# Patient Record
Sex: Female | Born: 1995 | Race: Black or African American | Hispanic: No | Marital: Single | Smoking: Never smoker
Health system: Southern US, Community
[De-identification: ages and names within clinical notes are randomized; demographics above are authoritative.]

## PROBLEM LIST (undated history)

## (undated) DIAGNOSIS — Z789 Other specified health status: Secondary | ICD-10-CM

---
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
## 2015-02-18 IMAGING — CT CT ABD-PELV W/ CM
1 of 2 series · 15 of 32 positions shown, 19 images · IV contrast (omnipaque)
Comparison: None.

CLINICAL DATA: Acute onset of mid abdominal pain. No bowel
movements for 4 days. Leukocytosis. Initial encounter.

EXAM:
CT ABDOMEN AND PELVIS WITH CONTRAST
TECHNIQUE: Multidetector CT imaging of the abdomen and pelvis was performed
using the standard protocol following bolus administration of
intravenous contrast.
CONTRAST:  50mL OMNIPAQUE IOHEXOL 300 MG/ML SOLN, 80mL OMNIPAQUE
IOHEXOL 300 MG/ML SOLN

[Series 2: abd/pel with · axial · 0.65mm/px · z∈[-416,-66]mm · 15 of 78 slices shown, 19 images]
[im 4/78  soft-tissue]
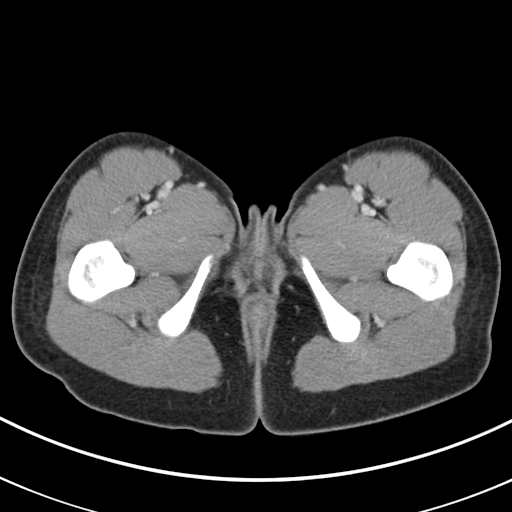
[im 4/78  bone]
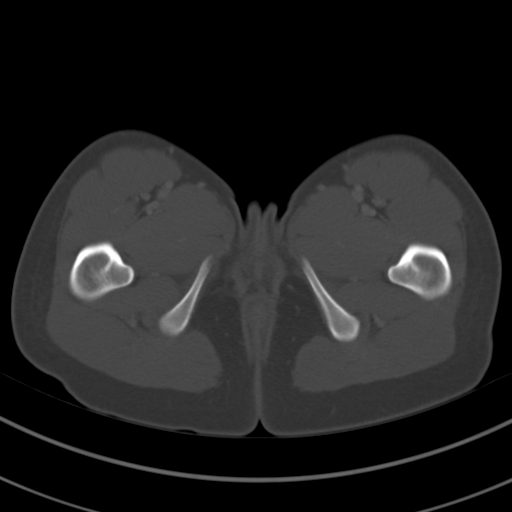
[im 10/78  soft-tissue]
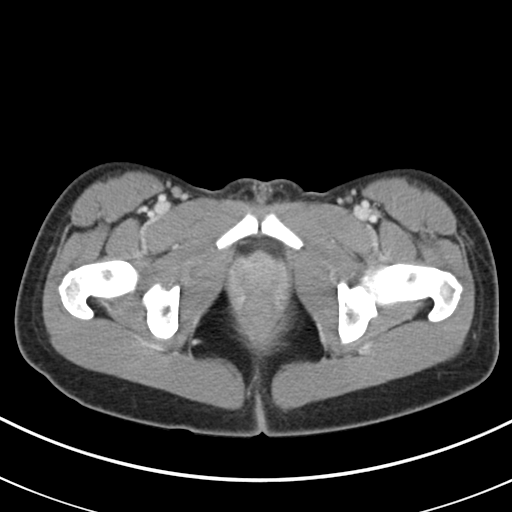
[im 17/78  soft-tissue]
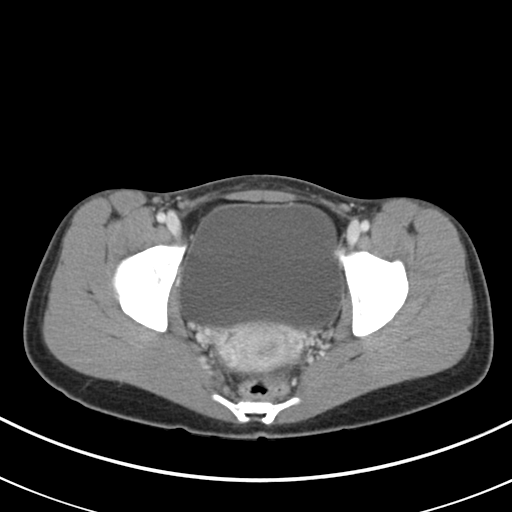
[im 23/78  soft-tissue]
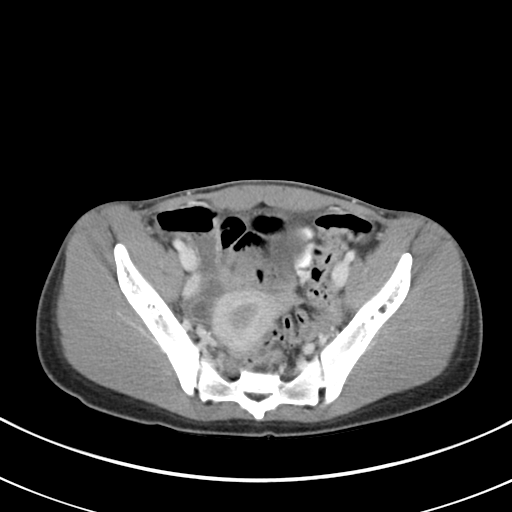
[im 26/78  soft-tissue]
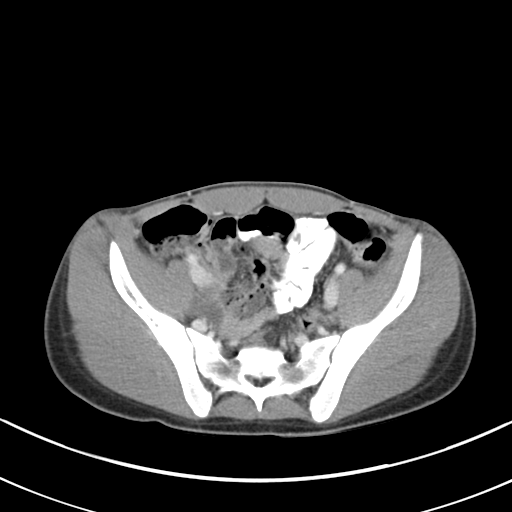
[im 33/78  soft-tissue]
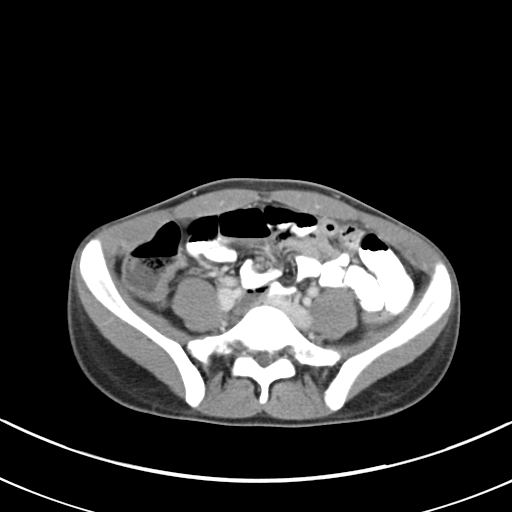
[im 39/78  soft-tissue]
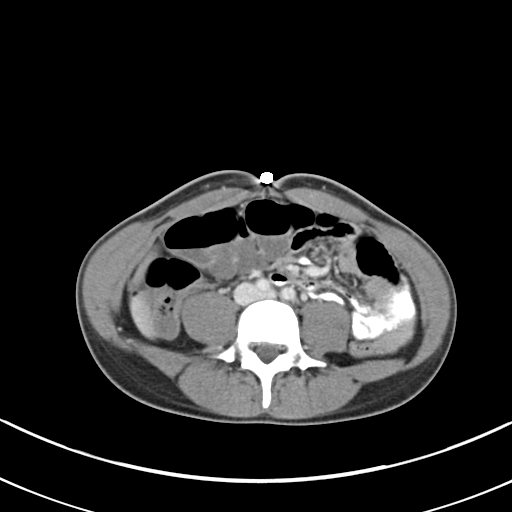
[im 45/78  soft-tissue]
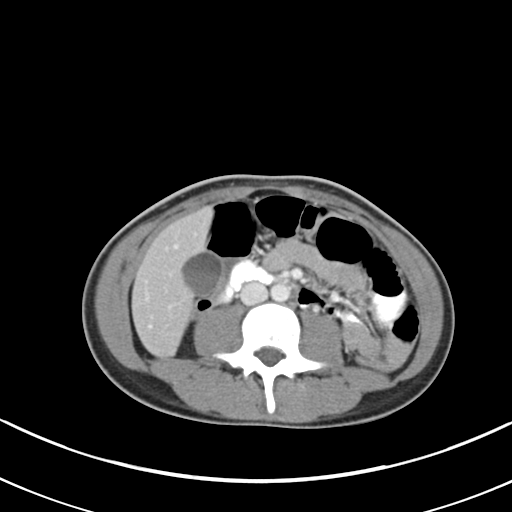
[im 52/78  soft-tissue]
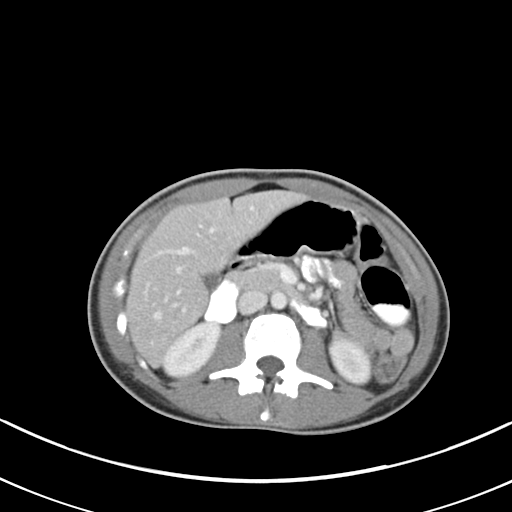
[im 52/78  bone]
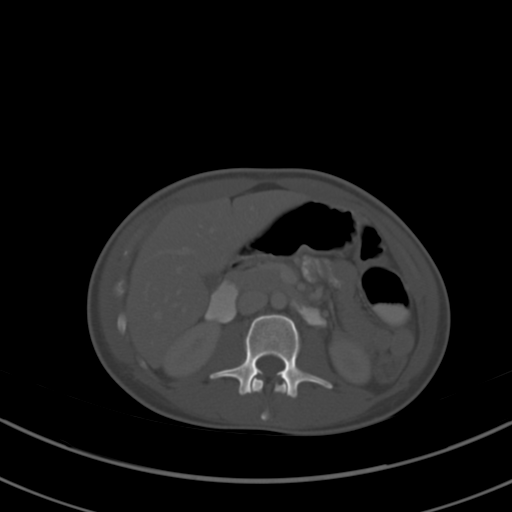
[im 55/78  soft-tissue]
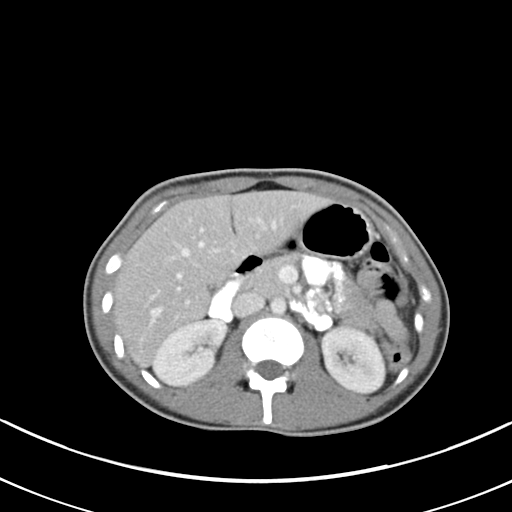
[im 61/78  soft-tissue]
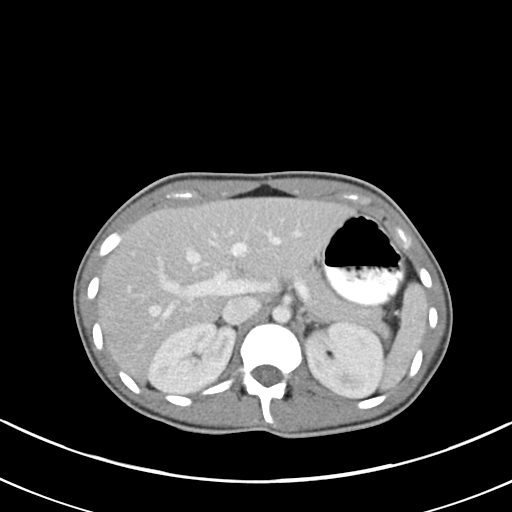
[im 65/78  lung]
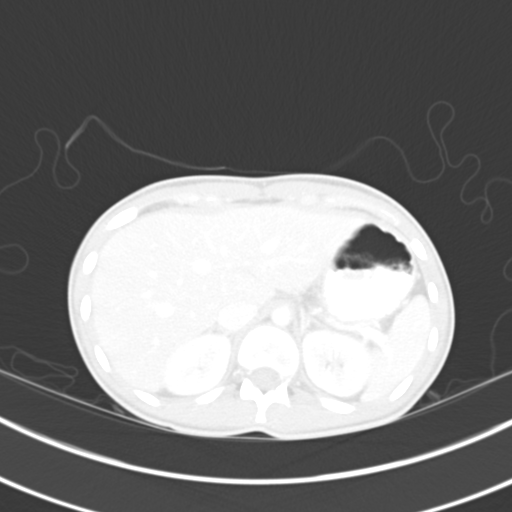
[im 68/78  soft-tissue]
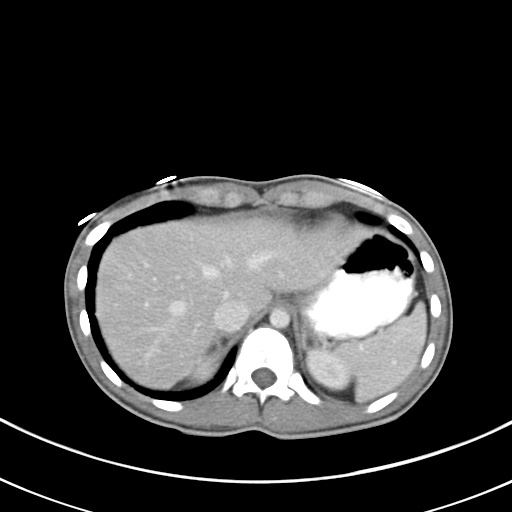
[im 68/78  lung]
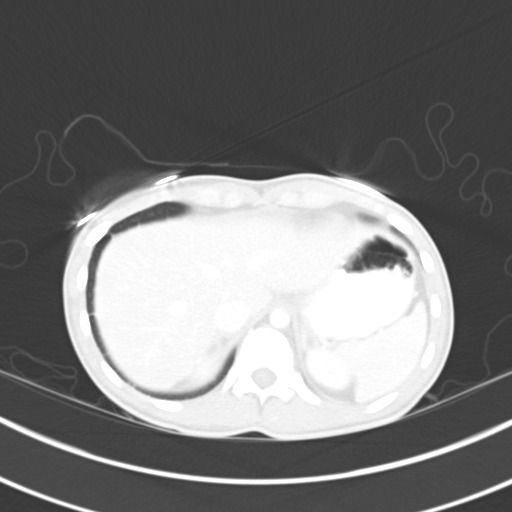
[im 71/78  lung]
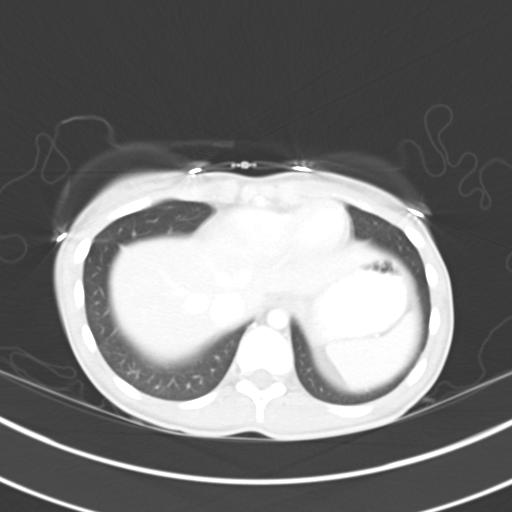
[im 74/78  soft-tissue]
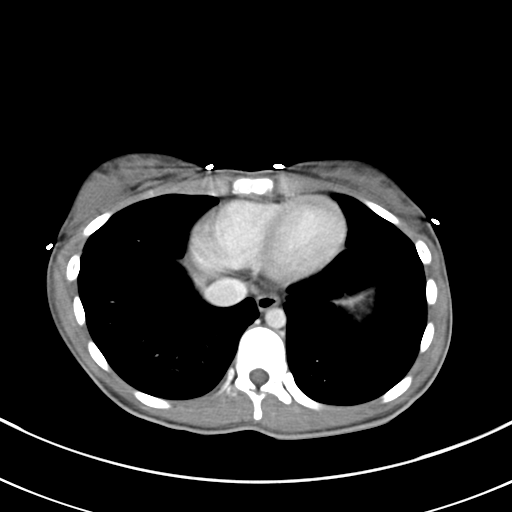
[im 74/78  lung]
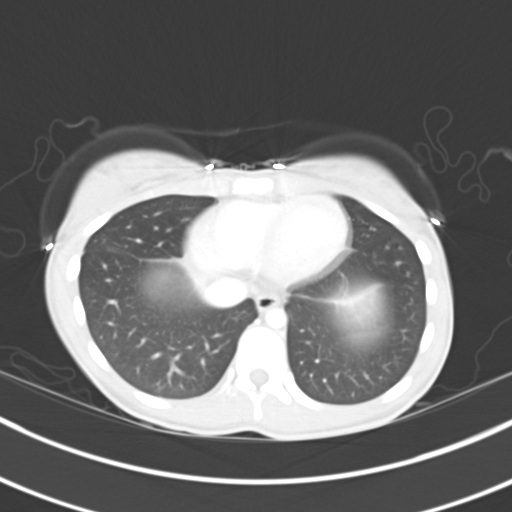

[15 of 32 positions shown; findings below may reference images not displayed]

FINDINGS: The visualized lung bases are clear.

The liver and spleen are unremarkable in appearance. The gallbladder
is within normal limits. The pancreas and adrenal glands are
unremarkable.

The kidneys are unremarkable in appearance. There is no evidence of
hydronephrosis. No renal or ureteral stones are seen. No perinephric
stranding is appreciated.

No free fluid is identified. The small bowel is unremarkable in
appearance. The stomach is within normal limits. No acute vascular
abnormalities are seen.

The appendix appears to be normal in caliber, partially
characterized on coronal images, though difficult to assess due to
several small bowel loops at the right lower quadrant. There is no
definite evidence of appendicitis. Mildly prominent pericecal nodes
are noted, raising question for mild mesenteric adenitis.

The colon is grossly unremarkable in appearance.

The bladder is mildly distended and grossly unremarkable. The uterus
is within normal limits. A 2.0 cm cystic focus at the right adnexa
likely reflects a normal follicle. No suspicious adnexal masses are
seen. The ovaries are otherwise symmetric. No inguinal
lymphadenopathy is seen.

No acute osseous abnormalities are identified.
IMPRESSION: 1. No definite evidence of appendicitis.
2. Mildly prominent pericecal nodes could reflect mild mesenteric
adenitis, though somewhat less common in adults.

## 2015-02-24 IMAGING — US US ART/VEN ABD/PELV/SCROTUM DOPPLER LTD
1 series · 13 of 25 positions shown · non-contrast
Comparison: CT abdomen/ pelvis 11/19/2014

CLINICAL DATA: Right pelvic pain, evaluate for ovarian torsion

EXAM:
TRANSABDOMINAL AND TRANSVAGINAL ULTRASOUND OF PELVIS
DOPPLER ULTRASOUND OF OVARIES
TECHNIQUE: Both transabdominal and transvaginal ultrasound examinations of the
pelvis were performed. Transabdominal technique was performed for
global imaging of the pelvis including uterus, ovaries, adnexal
regions, and pelvic cul-de-sac.
It was necessary to proceed with endovaginal exam following the
transabdominal exam to visualize the right ovary. Color and duplex
Doppler ultrasound was utilized to evaluate blood flow to the
ovaries.

[Series 1: us art/ven abd/pelv/scrotum doppler ltd · 0.20mm/px · 62 acquisitions, 13 frames shown]
[im 1/62]
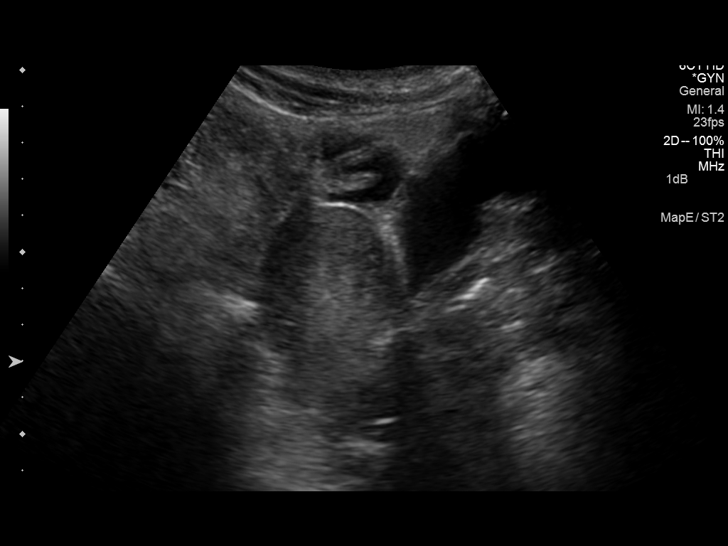
[im 6/62]
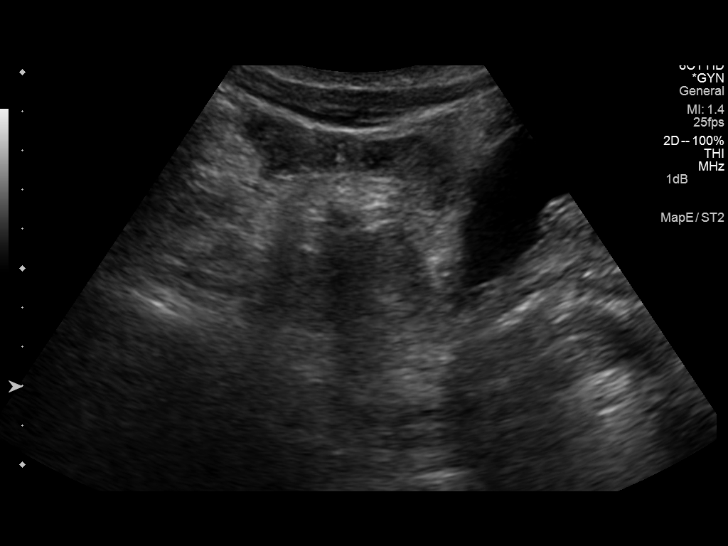
[im 11/62]
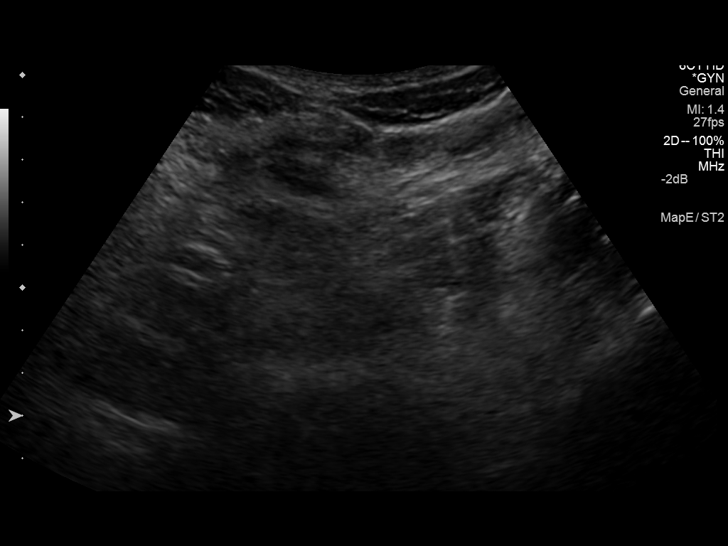
[im 16/62]
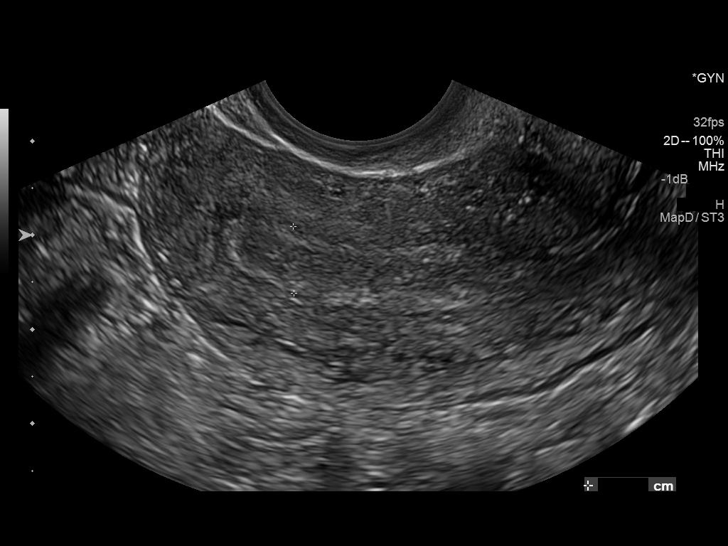
[im 21/62]
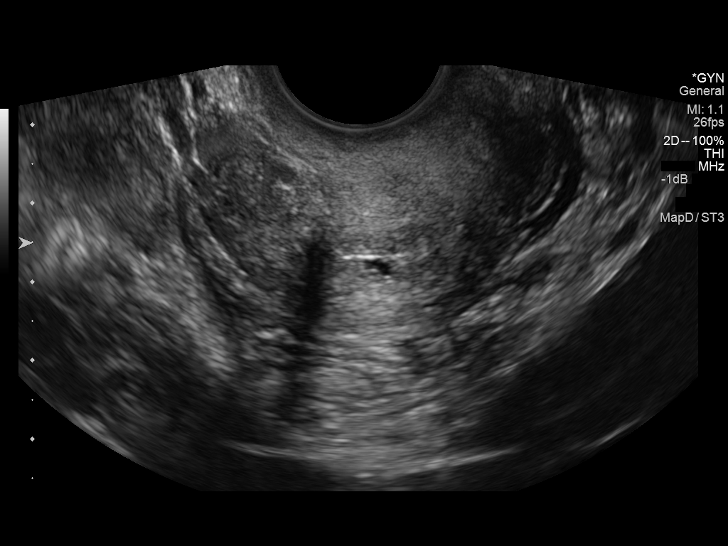
[im 26/62]
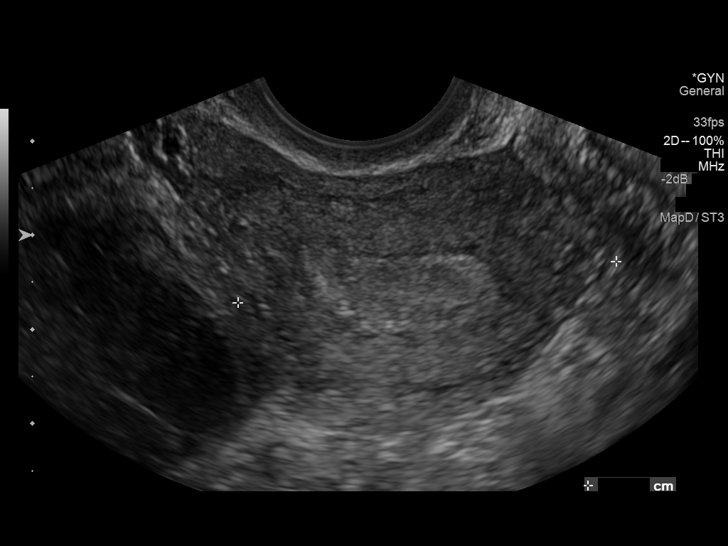
[im 31/62]
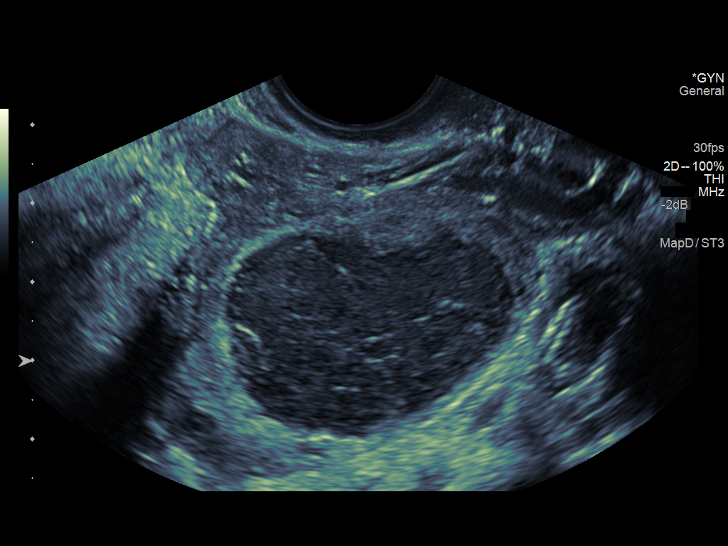
[im 36/62]
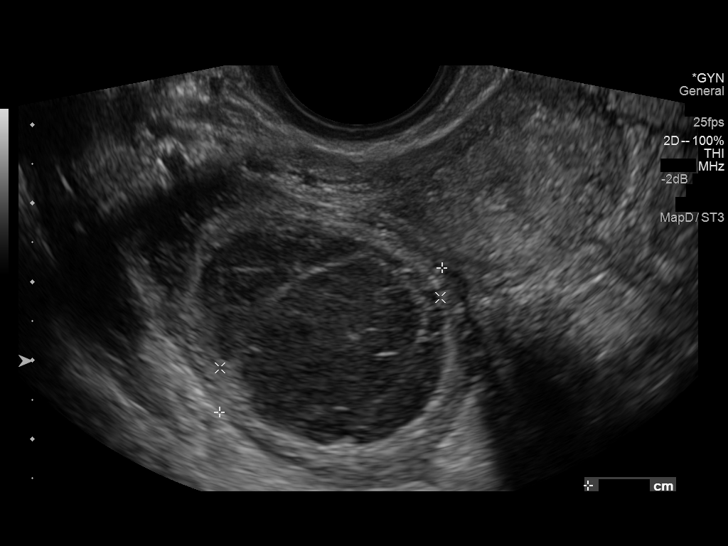
[im 41/62]
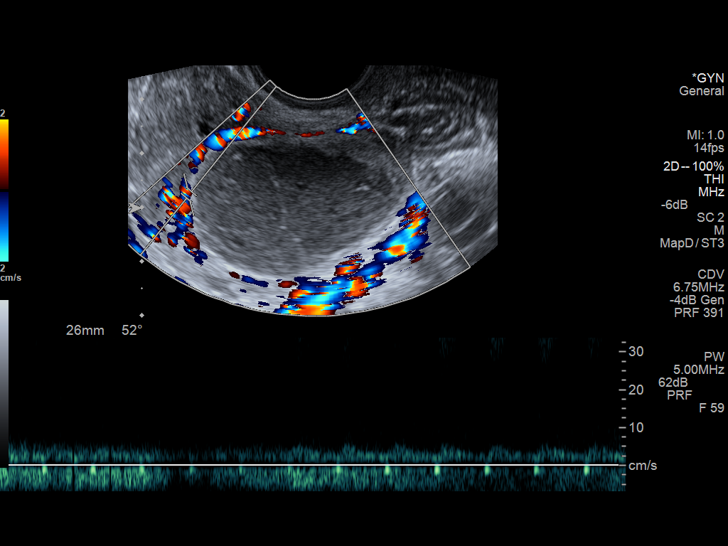
[im 46/62]
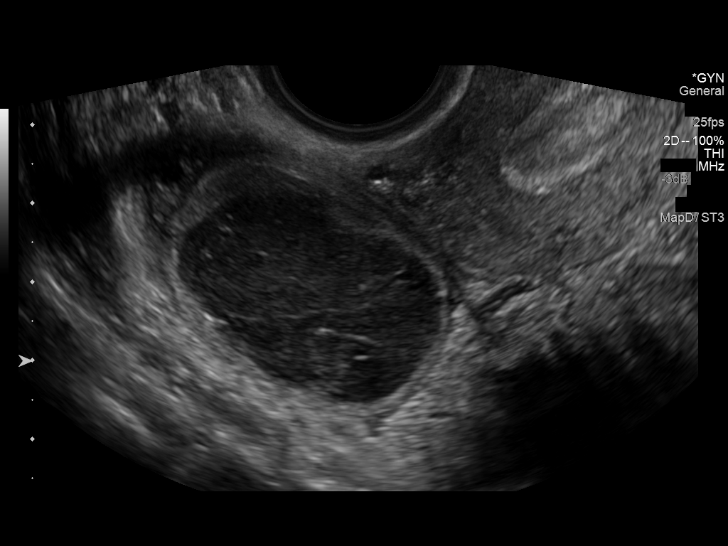
[im 51/62]
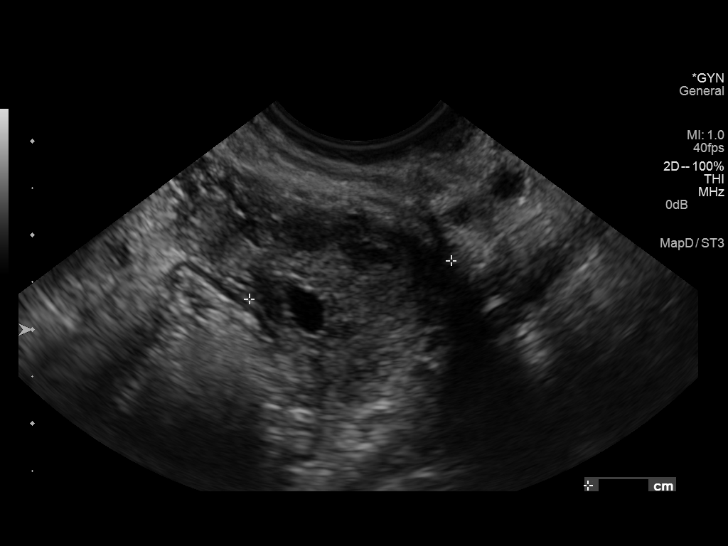
[im 56/62]
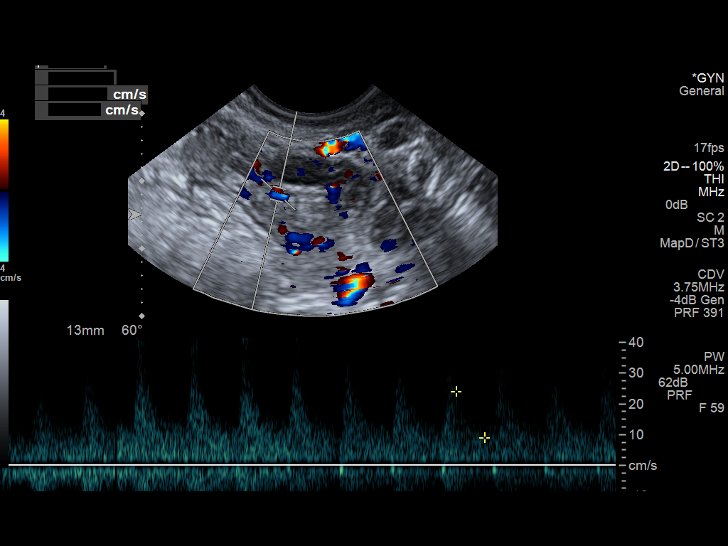
[im 62/62]
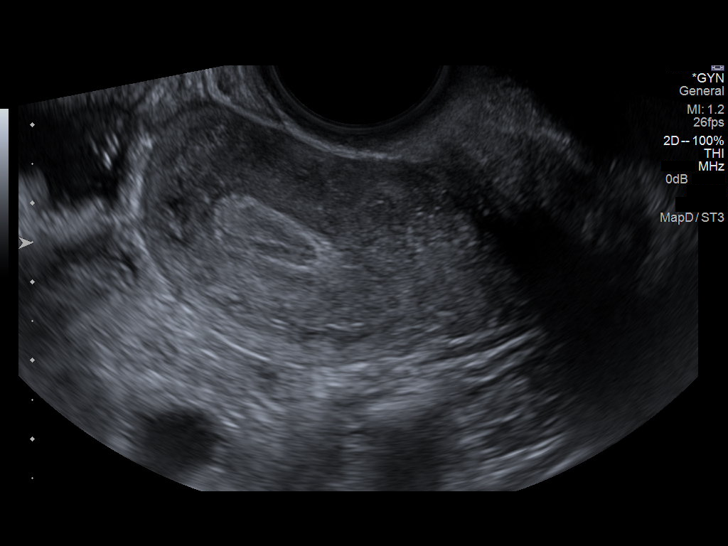

[13 of 25 positions shown; findings below may reference images not displayed]

FINDINGS: Uterus

Measurements: 6.4 x 4.0 x 2.8 cm. No fibroids or other mass
visualized. Incidental note is made of cervical nabothian cysts.

Endometrium

Thickness: 7 mm.  No focal abnormality visualized.

Right ovary

Measurements: 5.0 x 3.8 x 3.4 cm. The right ovary is expanded by a 4
x 2.8 x 2.9 cm complex cyst containing low level internal echoes and
thin echogenic septations. This sonographic appearance is most
suggestive of a hemorrhagic cyst. An endometrioma is also a
possibility. No internal vascularity.

Left ovary

Measurements: 2.6 x 2.2 x 2.2 cm. Normal appearance/no adnexal mass.

Pulsed Doppler evaluation of both ovaries demonstrates normal
low-resistance arterial and venous waveforms.

Other findings

No free fluid.
IMPRESSION: 1. Complex 4 x 2.8 x 2.9 cm cyst expanding in the right ovary.
Primary differential considerations include hemorrhagic cyst and
endometrioma. A hemorrhagic cyst is favored given the findings of a
2 cm simple cyst on the relatively recent CT scan of the abdomen and
pelvis. Short-interval follow up ultrasound in 6-12 weeks is
recommended, preferably during the week following the patient's
normal menses.
2. No evidence of ovarian torsion at this time.
